# Patient Record
Sex: Male | Born: 2000 | Hispanic: Yes | Marital: Single
Health system: Southern US, Community
[De-identification: ages and names within clinical notes are randomized; demographics above are authoritative.]

## PROBLEM LIST (undated history)

## (undated) SURGERY — Surgical Case
Anesthesia: *Unknown

---
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
## 2020-10-13 ENCOUNTER — Ambulatory Visit: Payer: Medicaid Other | Admitting: Nurse Practitioner

## 2020-10-13 ENCOUNTER — Other Ambulatory Visit: Payer: Self-pay

## 2020-10-13 ENCOUNTER — Encounter: Payer: Self-pay | Admitting: Nurse Practitioner

## 2020-10-13 VITALS — BP 136/75 | HR 87 | Temp 97.8°F | Ht 66.0 in | Wt 162.0 lb

## 2020-10-13 DIAGNOSIS — N50811 Right testicular pain: Secondary | ICD-10-CM | POA: Diagnosis not present

## 2020-10-13 LAB — URINALYSIS, COMPLETE
Bilirubin, UA: NEGATIVE
Glucose, UA: NEGATIVE
Ketones, UA: NEGATIVE
Leukocytes,UA: NEGATIVE
Nitrite, UA: NEGATIVE
Protein,UA: NEGATIVE
Specific Gravity, UA: 1.015 (ref 1.005–1.030)
Urobilinogen, Ur: 0.2 mg/dL (ref 0.2–1.0)
pH, UA: 7 (ref 5.0–7.5)

## 2020-10-13 LAB — MICROSCOPIC EXAMINATION
Bacteria, UA: NONE SEEN
Epithelial Cells (non renal): NONE SEEN /hpf (ref 0–10)
RBC: NONE SEEN /hpf (ref 0–2)
WBC, UA: NONE SEEN /hpf (ref 0–5)

## 2020-10-13 MED ORDER — IBUPROFEN 600 MG PO TABS: 600.0000 mg | ORAL_TABLET | Freq: Three times a day (TID) | ORAL | 0 refills | Status: AC | PRN

## 2020-10-13 NOTE — Progress Notes (Addendum)
New Patient Note  RE: Aaron Vasquez MRN: [id number] DOB: April 26, 2001 Date of Office Visit: 10/13/2020  Chief Complaint: New Patient (Initial Visit) and Testicle Pain  History of Present Illness: Pain  He reports recurrent right testicle pain. was not an injury that may have caused the pain. The pain started a few months ago and is staying constant. The pain does not radiate dull aching. The pain is described as aching, is moderate in intensity, occurring intermittently. Symptoms are worse in the: Daytime Aggravating factors: Activity Relieving factors: none.  He has tried nothing for symptoms   ---------------------------------------------------------------------------------------------------   Assessment and Plan: Aaron Vasquez is a 20 y.o. male with:  Pain in right testicle:   Pain in right testicle Symptoms of right testicular pain present in the  past few months.  Patient reports playing soccer has been hit with a soccer ball in the past.  Patient is reporting pain as dull and intermittent during the day.  No fever or chills, nausea or vomiting. Ultrasound orders completed results pending, urinalysis ordered results pending. Advised patient to use anti-inflammatory for pain and discomfort. Follow-up with worsening or unresolved symptoms.  Patient verbalized understanding.  Rx sent to pharmacy.  Return if symptoms worsen or fail to improve.   Diagnostics:   Past Medical History: Patient Active Problem List   Diagnosis Date Noted  . Pain in right testicle 10/13/2020  . Closed fracture distal radius and ulna 10/31/2013   History reviewed. No pertinent past medical history. Past Surgical History: Past Surgical History:  Procedure Laterality Date  . FRACTURE SURGERY Left 10/2013   wrist  . PERCUTANEOUS PINNING Left 10/31/2013   Procedure: CLOSED REDUCTION PERCUTANEOUS PINNING LEFT DISTAL RADIUS;  Surgeon: Budd Palmer, MD;  Location: [facility] OR;  Service: Orthopedics;  Laterality:  Left;   Medication List:  Current Outpatient Medications  Medication Sig Dispense Refill  . ibuprofen (ADVIL) 600 MG tablet Take 1 tablet (600 mg total) by mouth every 8 (eight) hours as needed. 30 tablet 0   No current facility-administered medications for this visit.   Allergies: No Known Allergies Social History: Social History   Socioeconomic History  . Marital status: Single    Spouse name: Not on file  . Number of children: Not on file  . Years of education: Not on file  . Highest education level: Not on file  Occupational History  . Not on file  Tobacco Use  . Smoking status: Passive Smoke Exposure - Never Smoker  . Smokeless tobacco: Never Used  Substance and Sexual Activity  . Alcohol use: No  . Drug use: No  . Sexual activity: Not Currently  Other Topics Concern  . Not on file  Social History Narrative  . Not on file   Social Determinants of Health   Financial Resource Strain: Not on file  Food Insecurity: Not on file  Transportation Needs: Not on file  Physical Activity: Not on file  Stress: Not on file  Social Connections: Not on file       Family History: History reviewed. No pertinent family history.       Review of Systems  Constitutional: Negative.   HENT: Negative.   Eyes: Negative.   Respiratory: Negative.   Genitourinary: Positive for testicular pain. Negative for decreased urine volume, difficulty urinating, dysuria, flank pain, hematuria, penile discharge, penile pain and urgency.  Skin: Negative.   All other systems reviewed and are negative.  Objective: BP 136/75   Pulse 87   Temp 97.8  F (36.6 C) (Temporal)   Ht 5\' 6"  (1.676 m)   Wt 162 lb (73.5 kg)   SpO2 99%   BMI 26.15 kg/m  Body mass index is 26.15 kg/m. Physical Exam Vitals reviewed.  Constitutional:      Appearance: Normal appearance.  HENT:     Head: Normocephalic.     Nose: Nose normal.     Mouth/Throat:     Mouth: Mucous membranes are moist.     Pharynx:  Oropharynx is clear.  Eyes:     Conjunctiva/sclera: Conjunctivae normal.  Cardiovascular:     Pulses: Normal pulses.     Heart sounds: Normal heart sounds.  Pulmonary:     Effort: Pulmonary effort is normal.     Breath sounds: Normal breath sounds.  Abdominal:     General: Bowel sounds are normal.  Genitourinary:    Comments: Nonacute testicular pain Musculoskeletal:        General: Tenderness present. Normal range of motion.  Skin:    General: Skin is warm.  Neurological:     Mental Status: He is alert and oriented to person, place, and time.  Psychiatric:        Behavior: Behavior normal.    The plan was reviewed with the patient/family, and all questions/concerned were addressed.  It was my pleasure to see Aaron Vasquez today and participate in his care. Please feel free to contact me with any questions or concerns.  Sincerely,  Marquita Palms NP [facility]

## 2020-10-13 NOTE — Assessment & Plan Note (Addendum)
Symptoms of right testicular pain present in the  past few months.  Patient reports playing soccer has been hit with a soccer ball in the past.  Patient is reporting pain as dull and intermittent during the day.  No fever or chills, nausea or vomiting. Ultrasound orders completed results pending, urinalysis ordered results pending. Advised patient to use anti-inflammatory for pain and discomfort. Follow-up with worsening or unresolved symptoms.  Patient verbalized understanding.  Rx sent to pharmacy.

## 2020-10-13 NOTE — Patient Instructions (Signed)
Inguinal Hernia, Adult An inguinal hernia is when fat or your intestines push through a weak spot in a muscle where your leg meets your lower belly (groin). This causes a bulge. This kind of hernia could also be:  In your scrotum, if you are male.  In folds of skin around your vagina, if you are male. There are three types of inguinal hernias:  Hernias that can be pushed back into the belly (are reducible). This type rarely causes pain.  Hernias that cannot be pushed back into the belly (are incarcerated).  Hernias that cannot be pushed back into the belly and lose their blood supply (are strangulated). This type needs emergency surgery. What are the causes? This condition is caused by having a weak spot in the muscles or tissues in your groin. This develops over time. The hernia may poke through the weak spot when you strain your lower belly muscles all of a sudden, such as when you:  Lift a heavy object.  Strain to poop (have a bowel movement). Trouble pooping (constipation) can lead to straining.  Cough. What increases the risk? This condition is more likely to develop in:  Males.  Pregnant females.  People who: ? Are overweight. ? Work in jobs that require long periods of standing or heavy lifting. ? Have had an inguinal hernia before. ? Smoke or have lung disease. These factors can lead to long-term (chronic) coughing. What are the signs or symptoms? Symptoms may depend on the size of the hernia. Often, a small hernia has no symptoms. Symptoms of a larger hernia may include:  A bulge in the groin area. This is easier to see when standing. You might not be able to see it when you are lying down.  Pain or burning in the groin. This may get worse when you lift, strain, or cough.  A dull ache or a feeling of pressure in the groin.  An abnormal bulge in the scrotum, in males. Symptoms of a strangulated inguinal hernia may include:  A bulge in your groin that is very  painful and tender to the touch.  A bulge that turns red or purple.  Fever, feeling like you may vomit (nausea), and vomiting.  Not being able to poop or to pass gas. How is this treated? Treatment depends on the size of your hernia and whether you have symptoms. If you do not have symptoms, your doctor may have you watch your hernia carefully and have you come in for follow-up visits. If your hernia is large or if you have symptoms, you may need surgery to repair the hernia. Follow these instructions at home: Lifestyle  Avoid lifting heavy objects.  Avoid standing for long amounts of time.  Do not smoke or use any products that contain nicotine or tobacco. If you need help quitting, ask your doctor.  Stay at a healthy weight. Prevent trouble pooping You may need to take these actions to prevent or treat trouble pooping:  Drink enough fluid to keep your pee (urine) pale yellow.  Take over-the-counter or prescription medicines.  Eat foods that are high in fiber. These include beans, whole grains, and fresh fruits and vegetables.  Limit foods that are high in fat and sugar. These include fried or sweet foods. General instructions  You may try to push your hernia back in place by very gently pressing on it when you are lying down. Do not try to push the bulge back in if it will not go in   easily.  Watch your hernia for any changes in shape, size, or color. Tell your doctor if you see any changes.  Take over-the-counter and prescription medicines only as told by your doctor.  Keep all follow-up visits. Contact a doctor if:  You have a fever or chills.  You have new symptoms.  Your symptoms get worse. Get help right away if:  You have pain in your groin that gets worse all of a sudden.  You have a bulge in your groin that: ? Gets bigger all of a sudden, and it does not get smaller after that. ? Turns red or purple. ? Is painful when you touch it.  You are a male, and  you have: ? Sudden pain in your scrotum. ? A sudden change in the size of your scrotum.  You cannot push the hernia back in place by very gently pressing on it when you are lying down.  You feel like you may vomit, and that feeling does not go away.  You keep vomiting.  You have a fast heartbeat.  You cannot poop or pass gas. These symptoms may be an emergency. Get help right away. Call your local emergency services (911 in the [place]  Do not wait to see if the symptoms will go away.  Do not drive yourself to the hospital. Summary  An inguinal hernia is when fat or your intestines push through a weak spot in a muscle where your leg meets your lower belly (groin). This causes a bulge.  If you do not have symptoms, you may not need treatment. If you have symptoms or a large hernia, you may need surgery.  Avoid lifting heavy objects. Also, avoid standing for long amounts of time.  Do not try to push the bulge back in if it will not go in easily. This information is not intended to replace advice given to you by your health care provider. Make sure you discuss any questions you have with your health care provider. Document Revised: 03/08/2020 Document Reviewed: 03/08/2020 [employer] Patient Education  2021 [employer]  

## 2020-10-17 ENCOUNTER — Other Ambulatory Visit: Payer: Self-pay | Admitting: Nurse Practitioner

## 2020-10-17 DIAGNOSIS — N5082 Scrotal pain: Secondary | ICD-10-CM

## 2020-10-25 ENCOUNTER — Ambulatory Visit (HOSPITAL_COMMUNITY): Admission: RE | Admit: 2020-10-25 | Payer: Medicaid Other | Source: Ambulatory Visit
